# Patient Record
Sex: Male | Born: 1990 | Race: White | Hispanic: No | Marital: Single
Health system: Southern US, Academic
[De-identification: ages and names within clinical notes are randomized; demographics above are authoritative.]

## PROBLEM LIST (undated history)

## (undated) DIAGNOSIS — M419 Scoliosis, unspecified: Secondary | ICD-10-CM

## (undated) DIAGNOSIS — G545 Neuralgic amyotrophy: Secondary | ICD-10-CM

---
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
## 2014-11-27 ENCOUNTER — Emergency Department (HOSPITAL_BASED_OUTPATIENT_CLINIC_OR_DEPARTMENT_OTHER): Payer: 59

## 2014-11-27 ENCOUNTER — Observation Stay (HOSPITAL_BASED_OUTPATIENT_CLINIC_OR_DEPARTMENT_OTHER)
Admission: EM | Admit: 2014-11-27 | Discharge: 2014-11-28 | Disposition: A | Discharge status: Home / Self Care | Payer: 59 | Attending: General Surgery | Admitting: General Surgery

## 2014-11-27 ENCOUNTER — Encounter (HOSPITAL_BASED_OUTPATIENT_CLINIC_OR_DEPARTMENT_OTHER): Payer: Self-pay

## 2014-11-27 DIAGNOSIS — F17210 Nicotine dependence, cigarettes, uncomplicated: Secondary | ICD-10-CM | POA: Diagnosis not present

## 2014-11-27 DIAGNOSIS — K3580 Unspecified acute appendicitis: Principal | ICD-10-CM | POA: Diagnosis present

## 2014-11-27 DIAGNOSIS — M419 Scoliosis, unspecified: Secondary | ICD-10-CM | POA: Insufficient documentation

## 2014-11-27 DIAGNOSIS — R109 Unspecified abdominal pain: Secondary | ICD-10-CM

## 2014-11-27 DIAGNOSIS — K353 Acute appendicitis with localized peritonitis, without perforation or gangrene: Secondary | ICD-10-CM

## 2014-11-27 HISTORY — DX: Scoliosis, unspecified: M41.9

## 2014-11-27 LAB — CBC WITH DIFFERENTIAL/PLATELET
Basophils Absolute: 0 10*3/uL (ref 0.0–0.1)
Basophils Relative: 0 % (ref 0–1)
EOS ABS: 0 10*3/uL (ref 0.0–0.7)
EOS PCT: 0 % (ref 0–5)
HEMATOCRIT: 49.6 % (ref 39.0–52.0)
HEMOGLOBIN: 17.3 g/dL — AB (ref 13.0–17.0)
LYMPHS ABS: 3 10*3/uL (ref 0.7–4.0)
Lymphocytes Relative: 17 % (ref 12–46)
MCH: 31.3 pg (ref 26.0–34.0)
MCHC: 34.9 g/dL (ref 30.0–36.0)
MCV: 89.7 fL (ref 78.0–100.0)
MONO ABS: 1.3 10*3/uL — AB (ref 0.1–1.0)
MONOS PCT: 7 % (ref 3–12)
NEUTROS PCT: 76 % (ref 43–77)
Neutro Abs: 13.5 10*3/uL — ABNORMAL HIGH (ref 1.7–7.7)
Platelets: 249 10*3/uL (ref 150–400)
RBC: 5.53 MIL/uL (ref 4.22–5.81)
RDW: 12.9 % (ref 11.5–15.5)
WBC: 17.8 10*3/uL — ABNORMAL HIGH (ref 4.0–10.5)

## 2014-11-27 LAB — URINALYSIS, ROUTINE W REFLEX MICROSCOPIC
Bilirubin Urine: NEGATIVE
Glucose, UA: NEGATIVE mg/dL
Hgb urine dipstick: NEGATIVE
Ketones, ur: NEGATIVE mg/dL
LEUKOCYTES UA: NEGATIVE
NITRITE: NEGATIVE
PH: 7 (ref 5.0–8.0)
PROTEIN: NEGATIVE mg/dL
Specific Gravity, Urine: 1.01 (ref 1.005–1.030)
Urobilinogen, UA: 0.2 mg/dL (ref 0.0–1.0)

## 2014-11-27 LAB — BASIC METABOLIC PANEL
ANION GAP: 7 (ref 5–15)
BUN: 11 mg/dL (ref 6–20)
CHLORIDE: 102 mmol/L (ref 101–111)
CO2: 28 mmol/L (ref 22–32)
Calcium: 9 mg/dL (ref 8.9–10.3)
Creatinine, Ser: 0.86 mg/dL (ref 0.61–1.24)
GFR calc non Af Amer: >60 mL/min (ref >60)
Glucose, Bld: 115 mg/dL — ABNORMAL HIGH (ref 70–99)
POTASSIUM: 4 mmol/L (ref 3.5–5.1)
Sodium: 137 mmol/L (ref 135–145)

## 2014-11-27 LAB — LIPASE, BLOOD: Lipase: 23 U/L (ref 22–51)

## 2014-11-27 MED ORDER — ONDANSETRON HCL 4 MG/2ML IJ SOLN
4.0000 mg | Freq: Once | INTRAMUSCULAR | Status: AC
  Administered 2014-11-27: 4 mg via INTRAVENOUS
  Filled 2014-11-27: qty 2

## 2014-11-27 MED ORDER — SODIUM CHLORIDE 0.9 % IV SOLN
Freq: Once | INTRAVENOUS | Status: AC
  Administered 2014-11-27: 18:00:00 via INTRAVENOUS

## 2014-11-27 MED ORDER — HYDROMORPHONE HCL 1 MG/ML IJ SOLN
INTRAMUSCULAR | Status: AC
  Filled 2014-11-27: qty 1

## 2014-11-27 MED ORDER — HYDROMORPHONE HCL 1 MG/ML IJ SOLN
0.5000 mg | INTRAMUSCULAR | Status: DC | PRN
Start: 2014-11-27 — End: 2014-11-28
  Administered 2014-11-28 (×2): 2 mg via INTRAVENOUS
  Filled 2014-11-27 (×2): qty 2

## 2014-11-27 MED ORDER — ONDANSETRON HCL 4 MG/2ML IJ SOLN: 4.0000 mg | Freq: Once | INTRAMUSCULAR | Status: AC

## 2014-11-27 MED ORDER — IOHEXOL 300 MG/ML  SOLN
100.0000 mL | Freq: Once | INTRAMUSCULAR | Status: AC | PRN
  Administered 2014-11-27: 100 mL via INTRAVENOUS

## 2014-11-27 MED ORDER — HYDROMORPHONE HCL 1 MG/ML IJ SOLN
1.0000 mg | Freq: Once | INTRAMUSCULAR | Status: AC
Start: 2014-11-27 — End: 2014-11-27
  Administered 2014-11-27: 1 mg via INTRAVENOUS

## 2014-11-27 MED ORDER — HYDROMORPHONE HCL 1 MG/ML IJ SOLN
1.0000 mg | Freq: Once | INTRAMUSCULAR | Status: AC
  Administered 2014-11-27: 1 mg via INTRAVENOUS
  Filled 2014-11-27: qty 1

## 2014-11-27 MED ORDER — ONDANSETRON HCL 4 MG/2ML IJ SOLN
4.0000 mg | Freq: Four times a day (QID) | INTRAMUSCULAR | Status: DC | PRN
  Administered 2014-11-27 – 2014-11-28 (×2): 4 mg via INTRAVENOUS
  Filled 2014-11-27 (×2): qty 2

## 2014-11-27 MED ORDER — MORPHINE SULFATE 2 MG/ML IJ SOLN
1.0000 mg | INTRAMUSCULAR | Status: DC | PRN
  Administered 2014-11-27: 2 mg via INTRAVENOUS
  Filled 2014-11-27: qty 1

## 2014-11-27 MED ORDER — IOHEXOL 300 MG/ML  SOLN: 50.0000 mL | Freq: Once | INTRAMUSCULAR | Status: AC | PRN

## 2014-11-27 NOTE — ED Notes (Signed)
Complains of moderate nausea

## 2014-11-27 NOTE — ED Notes (Signed)
Dr. Byerly at bedside.  

## 2014-11-27 NOTE — H&P (Addendum)
Bradley Cain is an 24 y.o. male.   Chief Complaint: RLQ pain HPI:  Pt is a 24 yo M who presented with around 12 hours of acute RLQ pain.  This started around 11 AM.  The pain at first felt like a pulled muscle and was mild "like a 2 or 3."  The pain worsened.  The patient has had one episode of diarrhea, and had nausea/vomiting after drinking CT scan contrast.  He denies fever/chills.  He has had anorexia.  Pain is described as sharp.  Only narcotics have improved the pain.  Pain is worse with movement.     Past Medical History  Diagnosis Date  . Scoliosis     History reviewed. No pertinent past surgical history.  History reviewed. No pertinent family history. Social History:  reports that he has been smoking.  He does not have any smokeless tobacco history on file. He reports that he drinks alcohol. He reports that he uses illicit drugs (Marijuana).  Allergies: No Known Allergies  Meds:  MVT  Results for orders placed or performed during the hospital encounter of 11/27/14 (from the past 48 hour(s))  Basic metabolic panel     Status: Abnormal   Collection Time: 11/27/14  6:00 PM  Result Value Ref Range   Sodium 137 135 - 145 mmol/L   Potassium 4.0 3.5 - 5.1 mmol/L   Chloride 102 101 - 111 mmol/L   CO2 28 22 - 32 mmol/L   Glucose, Bld 115 (H) 70 - 99 mg/dL   BUN 11 6 - 20 mg/dL   Creatinine, Ser 0.86 0.61 - 1.24 mg/dL   Calcium 9.0 8.9 - 10.3 mg/dL   GFR calc non Af Amer >60 >60 mL/min   GFR calc Af Amer >60 >60 mL/min    Comment: (NOTE) The eGFR has been calculated using the CKD EPI equation. This calculation has not been validated in all clinical situations. eGFR's persistently <90 mL/min signify possible Chronic Kidney Disease.    Anion gap 7 5 - 15  CBC WITH DIFFERENTIAL     Status: Abnormal   Collection Time: 11/27/14  6:00 PM  Result Value Ref Range   WBC 17.8 (H) 4.0 - 10.5 K/uL   RBC 5.53 4.22 - 5.81 MIL/uL   Hemoglobin 17.3 (H) 13.0 - 17.0 g/dL   HCT 49.6  39.0 - 52.0 %   MCV 89.7 78.0 - 100.0 fL   MCH 31.3 26.0 - 34.0 pg   MCHC 34.9 30.0 - 36.0 g/dL   RDW 12.9 11.5 - 15.5 %   Platelets 249 150 - 400 K/uL   Neutrophils Relative % 76 43 - 77 %   Neutro Abs 13.5 (H) 1.7 - 7.7 K/uL   Lymphocytes Relative 17 12 - 46 %   Lymphs Abs 3.0 0.7 - 4.0 K/uL   Monocytes Relative 7 3 - 12 %   Monocytes Absolute 1.3 (H) 0.1 - 1.0 K/uL   Eosinophils Relative 0 0 - 5 %   Eosinophils Absolute 0.0 0.0 - 0.7 K/uL   Basophils Relative 0 0 - 1 %   Basophils Absolute 0.0 0.0 - 0.1 K/uL  Lipase, blood     Status: None   Collection Time: 11/27/14  6:00 PM  Result Value Ref Range   Lipase 23 22 - 51 U/L  Urinalysis, Routine w reflex microscopic     Status: None   Collection Time: 11/27/14  6:25 PM  Result Value Ref Range   Color, Urine YELLOW YELLOW  APPearance CLEAR CLEAR   Specific Gravity, Urine 1.010 1.005 - 1.030   pH 7.0 5.0 - 8.0   Glucose, UA NEGATIVE NEGATIVE mg/dL   Hgb urine dipstick NEGATIVE NEGATIVE   Bilirubin Urine NEGATIVE NEGATIVE   Ketones, ur NEGATIVE NEGATIVE mg/dL   Protein, ur NEGATIVE NEGATIVE mg/dL   Urobilinogen, UA 0.2 0.0 - 1.0 mg/dL   Nitrite NEGATIVE NEGATIVE   Leukocytes, UA NEGATIVE NEGATIVE    Comment: MICROSCOPIC NOT DONE ON URINES WITH NEGATIVE PROTEIN, BLOOD, LEUKOCYTES, NITRITE, OR GLUCOSE <1000 mg/dL.   Ct Abdomen Pelvis W Contrast  11/27/2014   CLINICAL DATA:  Sudden onset of right lower quadrant abdominal pain today with nausea, diarrhea. Leukocytosis. Initial encounter.  EXAM: CT ABDOMEN AND PELVIS WITH CONTRAST  TECHNIQUE: Multidetector CT imaging of the abdomen and pelvis was performed using the standard protocol following bolus administration of intravenous contrast.  CONTRAST:  176m OMNIPAQUE IOHEXOL 300 MG/ML  SOLN  COMPARISON:  None.  FINDINGS: Lower chest: Clear lung bases. No significant pleural or pericardial effusion. Bilateral gynecomastia noted.  Hepatobiliary: The liver is normal in density without  focal abnormality. Possible mild periportal edema within the liver. No evidence of gallstones or biliary dilatation.  Pancreas: Unremarkable. No pancreatic ductal dilatation or surrounding inflammatory changes.  Spleen: Normal in size without focal abnormality.  Adrenals/Urinary Tract: Both adrenal glands appear normal.Probable small cyst in the upper pole of the left kidney. The kidneys otherwise appear normal without evidence of urinary tract calculus, suspicious lesion or hydronephrosis. No bladder abnormalities are seen.  Stomach/Bowel: The appendix is distended to 10 mm. There is mild appendiceal wall thickening with minimal surrounding inflammatory change. No extraluminal fluid collection. Ingested material is present within the stomach. The small bowel and colon appear normal.  Vascular/Lymphatic: There are no enlarged abdominal or pelvic lymph nodes. No significant vascular findings are present.  Reproductive: Unremarkable.  Other: No evidence of abdominal wall mass or hernia.  Musculoskeletal: No acute or significant osseous findings.  IMPRESSION: 1. Appendiceal distention, mild wall thickening and minimal surrounding inflammatory change suspicious for early acute appendicitis. No evidence of rupture or abscess. 2. No other significant findings. 3. These results were called by telephone at the time of interpretation on 11/27/2014 at 8:12 pm to Dr. JCarlisle Cater, who verbally acknowledged these results.   Electronically Signed   By: WRichardean SaleM.D.   On: 11/27/2014 20:12    Review of Systems  Constitutional: Negative for fever and chills.  HENT: Negative.   Eyes: Negative.   Respiratory: Negative.   Cardiovascular: Negative.   Gastrointestinal: Positive for nausea and diarrhea.  Genitourinary: Negative.   Musculoskeletal: Negative.   Skin: Negative.   Neurological: Negative.   Endo/Heme/Allergies: Negative.   Psychiatric/Behavioral: Negative.     Blood pressure 136/78, pulse 60,  temperature 98.1 F (36.7 C), temperature source Oral, resp. rate 18, height 5' 7"  (1.702 m), weight 81.647 kg (180 lb), SpO2 98 %. Physical Exam  Constitutional: He is oriented to person, place, and time. He appears well-developed and well-nourished. No distress.  HENT:  Head: Normocephalic and atraumatic.  Eyes: Conjunctivae are normal. Pupils are equal, round, and reactive to light. Right eye exhibits no discharge. Left eye exhibits no discharge. No scleral icterus.  Neck: Normal range of motion. No tracheal deviation present. No thyromegaly present.  Cardiovascular: Normal rate and intact distal pulses.   Respiratory: Effort normal. No respiratory distress.  GI: Soft. He exhibits no distension. There is tenderness (RLQ). There is guarding (  voluntary guarding RLQ). There is no rebound.  Musculoskeletal: Normal range of motion.  Neurological: He is alert and oriented to person, place, and time.  Skin: Skin is warm and dry. No rash noted. He is not diaphoretic. No erythema. No pallor.  Psychiatric: He has a normal mood and affect. His behavior is normal. Judgment and thought content normal.     Assessment/Plan Acute appendicitis. NPO IVF IV Antibiotics Pain control To OR in the AM with Dr. Rosendo Gros for laparoscopic appendectomy  Appendectomy was described to the patient.  The incisions and surgical technique were explained.  The patient was advised that some of the hair on the abdomen would be clipped, and that a foley catheter would be placed.  I advised the patient of the risks of surgery including, but not limited to, bleeding, infection, damage to other structures, risk of an open operation, risk of abscess, and risk of blood clot.  The recovery was also described to the patient.  He was advised that he will have lifting restrictions for 2 weeks.     Eddison Searls 11/27/2014, 10:40 PM

## 2014-11-27 NOTE — ED Notes (Signed)
PT with sudden onset of RLQ pain since 11am with nausea, diarrhea, worse with movement.

## 2014-11-27 NOTE — ED Notes (Signed)
Mild nausea

## 2014-11-27 NOTE — ED Notes (Signed)
[facility]

## 2014-11-27 NOTE — ED Provider Notes (Signed)
CSN: [id number]     Arrival date & time 11/27/14  1734 History   First MD Initiated Contact with Patient 11/27/14 1807     Chief Complaint  Patient presents with  . Abdominal Pain     (Consider location/radiation/quality/duration/timing/severity/associated sxs/prior Treatment) HPI Comments: Patient with no past surgical history presents with complaint of right lower quadrant abdominal pain starting acutely at 11 AM today. It is described as sharp and nonradiating. Pain was strong at the onset and gradually worsened. It was associated with one episode of watery, nonbloody diarrhea. Patient had associated nausea but no vomiting. No urinary symptoms including hematuria. No history of kidney stones. No fever, chest pain, shortness of breath. No penile discharge. No treatments prior to arrival. Pain is worse with movement and palpation. Nothing makes it better. Last oral intake 5pm.   The history is provided by the patient.    Past Medical History  Diagnosis Date  . Scoliosis    History reviewed. No pertinent past surgical history. History reviewed. No pertinent family history. History  Substance Use Topics  . Smoking status: Current Every Day Smoker  . Smokeless tobacco: Not on file  . Alcohol Use: Yes     Comment: every other day    Review of Systems  Constitutional: Negative for fever.  HENT: Negative for rhinorrhea and sore throat.   Eyes: Negative for redness.  Respiratory: Negative for cough.   Cardiovascular: Negative for chest pain.  Gastrointestinal: Positive for nausea, abdominal pain and diarrhea. Negative for vomiting.  Genitourinary: Negative for dysuria.  Musculoskeletal: Negative for myalgias.  Skin: Negative for rash.  Neurological: Negative for headaches.      Allergies  Review of patient's allergies indicates no known allergies.  Home Medications   Prior to Admission medications   Not on File   BP 134/83 mmHg  Pulse 65  Temp(Src) 98.2 F (36.8 C)  (Oral)  Resp 16  Ht 5\' 7"  (1.702 m)  Wt 180 lb (81.647 kg)  BMI 28.19 kg/m2  SpO2 99%   Physical Exam  Constitutional: He appears well-developed and well-nourished.  HENT:  Head: Normocephalic and atraumatic.  Eyes: Conjunctivae are normal. Right eye exhibits no discharge. Left eye exhibits no discharge.  Neck: Normal range of motion. Neck supple.  Cardiovascular: Normal rate, regular rhythm and normal heart sounds.   Pulmonary/Chest: Effort normal and breath sounds normal.  Abdominal: Soft. Bowel sounds are decreased. There is tenderness in the right lower quadrant and suprapubic area. There is tenderness at McBurney's point. There is no rigidity, no rebound, no guarding and negative Murphy's sign.  Neurological: He is alert.  Skin: Skin is warm and dry.  Psychiatric: He has a normal mood and affect.  Nursing note and vitals reviewed.   ED Course  Procedures (including critical care time) Labs Review Labs Reviewed  BASIC METABOLIC PANEL - Abnormal; Notable for the following:    Glucose, Bld 115 (*)    All other components within normal limits  CBC WITH DIFFERENTIAL/PLATELET - Abnormal; Notable for the following:    WBC 17.8 (*)    Hemoglobin 17.3 (*)    Neutro Abs 13.5 (*)    Monocytes Absolute 1.3 (*)    All other components within normal limits  LIPASE, BLOOD  URINALYSIS, ROUTINE W REFLEX MICROSCOPIC    Imaging Review Ct Abdomen Pelvis W Contrast  11/27/2014   CLINICAL DATA:  Sudden onset of right lower quadrant abdominal pain today with nausea, diarrhea. Leukocytosis. Initial encounter.  EXAM: CT  ABDOMEN AND PELVIS WITH CONTRAST  TECHNIQUE: Multidetector CT imaging of the abdomen and pelvis was performed using the standard protocol following bolus administration of intravenous contrast.  CONTRAST:  OMNIPAQUE IOHEXOL 300 MG/ML  SOLN  COMPARISON:  None.  FINDINGS: Lower chest: Clear lung bases. No significant pleural or pericardial effusion. Bilateral gynecomastia  noted.  Hepatobiliary: The liver is normal in density without focal abnormality. Possible mild periportal edema within the liver. No evidence of gallstones or biliary dilatation.  Pancreas: Unremarkable. No pancreatic ductal dilatation or surrounding inflammatory changes.  Spleen: Normal in size without focal abnormality.  Adrenals/Urinary Tract: Both adrenal glands appear normal.Probable small cyst in the upper pole of the left kidney. The kidneys otherwise appear normal without evidence of urinary tract calculus, suspicious lesion or hydronephrosis. No bladder abnormalities are seen.  Stomach/Bowel: The appendix is distended to 10 mm. There is mild appendiceal wall thickening with minimal surrounding inflammatory change. No extraluminal fluid collection. Ingested material is present within the stomach. The small bowel and colon appear normal.  Vascular/Lymphatic: There are no enlarged abdominal or pelvic lymph nodes. No significant vascular findings are present.  Reproductive: Unremarkable.  Other: No evidence of abdominal wall mass or hernia.  Musculoskeletal: No acute or significant osseous findings.  IMPRESSION: 1. Appendiceal distention, mild wall thickening and minimal surrounding inflammatory change suspicious for early acute appendicitis. No evidence of rupture or abscess. 2. No other significant findings. 3. These results were called by telephone at the time of interpretation on 11/27/2014 at 8:12 pm to Dr. Renne Crigler , who verbally acknowledged these results.   Electronically Signed   By: Carey Bullocks M.D.   On: 11/27/2014 20:12     EKG Interpretation None       6:15 PM Patient seen and examined. Work-up initiated. Medications ordered.   Vital signs reviewed and are as follows: BP 134/83 mmHg  Pulse 65  Temp(Src) 98.2 F (36.8 C) (Oral)  Resp 16  Ht  (1.702 m)  Wt 180 lb (81.647 kg)  BMI 28.19 kg/m2  SpO2 99%  8:37 PM spoke with radiologist regarding CT findings of acute  appendicitis. Patient has remained nothing by mouth since 5 PM, although patient vomited after eating earlier.  Spoke with Dr. Donell Beers who accepts patient.   Spoke with Dr. Ethelda Chick who is aware patient coming to ED.   Patient updated and additional pain medication ordered.   MDM   Final diagnoses:  Abdominal pain  Acute appendicitis with localized peritonitis   Admit.     Renne Crigler, PA-C 11/27/14 2040  Vanetta Mulders, MD 11/30/14 [phone]

## 2014-11-27 NOTE — ED Notes (Signed)
Bed: WA08 Expected date:  Expected time:  Means of arrival:  Comments: Transfer [facility]  Appendicitis

## 2014-11-27 NOTE — ED Notes (Signed)
Notified Art, [facility] to page Dr. Donell BeersByerly for this patient.

## 2014-11-27 NOTE — ED Provider Notes (Signed)
9:59 PM Assumed care of patient.  [facility]end[facility].  Dr. Donell BeersByerly with General Surgery was consulted by previous provider and agreed to admit the patient.  Patient reports that his pain and nausea are controlled at this time.  Dr. Donell BeersByerly has been paged and notified that the patient is in the ED.    Patient alert and orientated x 3  Heart:  RRR Lungs:  CTAB Abdomen:  Soft with tenderness to palpation of the RLQ.  No rebound or guarding.  11:30 PM Patient evaluated by Dr. Donell BeersByerly.  Patient admitted to Surgery.    Santiago GladHeather Valetta Mulroy, PA-C 11/28/14 [phone]

## 2014-11-28 ENCOUNTER — Observation Stay (HOSPITAL_COMMUNITY): Payer: 59 | Admitting: Anesthesiology

## 2014-11-28 ENCOUNTER — Encounter (HOSPITAL_COMMUNITY): Payer: Self-pay | Admitting: Certified Registered Nurse Anesthetist

## 2014-11-28 ENCOUNTER — Encounter (HOSPITAL_COMMUNITY)
Admission: EM | Disposition: A | Discharge status: Home / Self Care | Payer: Self-pay | Source: Home / Self Care | Attending: Emergency Medicine

## 2014-11-28 HISTORY — PX: LAPAROSCOPIC APPENDECTOMY: SHX408

## 2014-11-28 LAB — CBC
HCT: 47.8 % (ref 39.0–52.0)
Hemoglobin: 15.9 g/dL (ref 13.0–17.0)
MCH: 30.5 pg (ref 26.0–34.0)
MCHC: 33.3 g/dL (ref 30.0–36.0)
MCV: 91.7 fL (ref 78.0–100.0)
PLATELETS: 201 10*3/uL (ref 150–400)
RBC: 5.21 MIL/uL (ref 4.22–5.81)
RDW: 13 % (ref 11.5–15.5)
WBC: 20.5 10*3/uL — AB (ref 4.0–10.5)

## 2014-11-28 LAB — BASIC METABOLIC PANEL
Anion gap: 7 (ref 5–15)
BUN: 7 mg/dL (ref 6–20)
CHLORIDE: 102 mmol/L (ref 101–111)
CO2: 29 mmol/L (ref 22–32)
Calcium: 8.9 mg/dL (ref 8.9–10.3)
Creatinine, Ser: 0.85 mg/dL (ref 0.61–1.24)
Glucose, Bld: 133 mg/dL — ABNORMAL HIGH (ref 70–99)
POTASSIUM: 3.9 mmol/L (ref 3.5–5.1)
Sodium: 138 mmol/L (ref 135–145)

## 2014-11-28 LAB — SURGICAL PCR SCREEN
MRSA, PCR: NEGATIVE
Staphylococcus aureus: POSITIVE — AB

## 2014-11-28 SURGERY — APPENDECTOMY, LAPAROSCOPIC
Anesthesia: General | Site: Abdomen

## 2014-11-28 MED ORDER — KCL IN DEXTROSE-NACL 20-5-0.45 MEQ/L-%-% IV SOLN
INTRAVENOUS | Status: DC
  Administered 2014-11-28 (×2): via INTRAVENOUS
  Filled 2014-11-28 (×4): qty 1000

## 2014-11-28 MED ORDER — METOCLOPRAMIDE HCL 5 MG/ML IJ SOLN
INTRAMUSCULAR | Status: AC
  Filled 2014-11-28: qty 2

## 2014-11-28 MED ORDER — GLYCOPYRROLATE 0.2 MG/ML IJ SOLN
INTRAMUSCULAR | Status: DC | PRN
Start: 2014-11-28 — End: 2014-11-28
  Administered 2014-11-28: 0.2 mg via INTRAVENOUS
  Administered 2014-11-28: 0.6 mg via INTRAVENOUS

## 2014-11-28 MED ORDER — ROCURONIUM BROMIDE 100 MG/10ML IV SOLN
INTRAVENOUS | Status: AC
  Filled 2014-11-28: qty 1

## 2014-11-28 MED ORDER — PIPERACILLIN-TAZOBACTAM 3.375 G IVPB
3.3750 g | Freq: Three times a day (TID) | INTRAVENOUS | Status: DC
  Administered 2014-11-28 (×2): 3.375 g via INTRAVENOUS
  Filled 2014-11-28 (×4): qty 50

## 2014-11-28 MED ORDER — NEOSTIGMINE METHYLSULFATE 10 MG/10ML IV SOLN
INTRAVENOUS | Status: DC | PRN
  Administered 2014-11-28: 5 mg via INTRAVENOUS

## 2014-11-28 MED ORDER — GLYCOPYRROLATE 0.2 MG/ML IJ SOLN
INTRAMUSCULAR | Status: AC
  Filled 2014-11-28: qty 1

## 2014-11-28 MED ORDER — HYDROMORPHONE HCL 1 MG/ML IJ SOLN
0.2500 mg | INTRAMUSCULAR | Status: DC | PRN
  Administered 2014-11-28 (×2): 0.5 mg via INTRAVENOUS
  Administered 2014-11-28: 10:00:00 via INTRAVENOUS

## 2014-11-28 MED ORDER — GLYCOPYRROLATE 0.2 MG/ML IJ SOLN
INTRAMUSCULAR | Status: AC
  Filled 2014-11-28: qty 3

## 2014-11-28 MED ORDER — ONDANSETRON HCL 4 MG/2ML IJ SOLN
INTRAMUSCULAR | Status: DC | PRN
  Administered 2014-11-28: 4 mg via INTRAVENOUS

## 2014-11-28 MED ORDER — PANTOPRAZOLE SODIUM 40 MG IV SOLR
40.0000 mg | Freq: Every day | INTRAVENOUS | Status: DC
  Administered 2014-11-28: 40 mg via INTRAVENOUS
  Filled 2014-11-28 (×2): qty 40

## 2014-11-28 MED ORDER — HYDROCODONE-ACETAMINOPHEN 5-325 MG PO TABS: 1.0000 | ORAL_TABLET | ORAL | Status: AC | PRN

## 2014-11-28 MED ORDER — LACTATED RINGERS IV SOLN
INTRAVENOUS | Status: DC | PRN
  Administered 2014-11-28 (×2): via INTRAVENOUS

## 2014-11-28 MED ORDER — PIPERACILLIN-TAZOBACTAM 3.375 G IVPB
INTRAVENOUS | Status: AC
  Filled 2014-11-28: qty 50

## 2014-11-28 MED ORDER — ROCURONIUM BROMIDE 100 MG/10ML IV SOLN
INTRAVENOUS | Status: DC | PRN
  Administered 2014-11-28: 35 mg via INTRAVENOUS

## 2014-11-28 MED ORDER — SUCCINYLCHOLINE CHLORIDE 20 MG/ML IJ SOLN
INTRAMUSCULAR | Status: DC | PRN
  Administered 2014-11-28: 100 mg via INTRAVENOUS

## 2014-11-28 MED ORDER — DEXAMETHASONE SODIUM PHOSPHATE 10 MG/ML IJ SOLN
INTRAMUSCULAR | Status: AC
  Filled 2014-11-28: qty 1

## 2014-11-28 MED ORDER — ONDANSETRON HCL 4 MG PO TABS: 4.0000 mg | ORAL_TABLET | Freq: Four times a day (QID) | ORAL | Status: DC | PRN

## 2014-11-28 MED ORDER — LIDOCAINE HCL (CARDIAC) 20 MG/ML IV SOLN
INTRAVENOUS | Status: AC
  Filled 2014-11-28: qty 5

## 2014-11-28 MED ORDER — MIDAZOLAM HCL 2 MG/2ML IJ SOLN
INTRAMUSCULAR | Status: AC
  Filled 2014-11-28: qty 2

## 2014-11-28 MED ORDER — MIDAZOLAM HCL 5 MG/5ML IJ SOLN
INTRAMUSCULAR | Status: DC | PRN
  Administered 2014-11-28: 2 mg via INTRAVENOUS

## 2014-11-28 MED ORDER — LACTATED RINGERS IV SOLN
INTRAVENOUS | Status: DC
  Administered 2014-11-28: 09:00:00 via INTRAVENOUS

## 2014-11-28 MED ORDER — LACTATED RINGERS IR SOLN
Status: DC | PRN
  Administered 2014-11-28: 1000 mL

## 2014-11-28 MED ORDER — SENNA 8.6 MG PO TABS: 1.0000 | ORAL_TABLET | Freq: Two times a day (BID) | ORAL | Status: DC

## 2014-11-28 MED ORDER — PROPOFOL 10 MG/ML IV BOLUS
INTRAVENOUS | Status: DC | PRN
  Administered 2014-11-28: 150 mg via INTRAVENOUS

## 2014-11-28 MED ORDER — ONDANSETRON HCL 4 MG/2ML IJ SOLN
INTRAMUSCULAR | Status: AC
  Filled 2014-11-28: qty 2

## 2014-11-28 MED ORDER — NEOSTIGMINE METHYLSULFATE 10 MG/10ML IV SOLN
INTRAVENOUS | Status: AC
  Filled 2014-11-28: qty 1

## 2014-11-28 MED ORDER — ONDANSETRON HCL 4 MG/2ML IJ SOLN: 4.0000 mg | Freq: Four times a day (QID) | INTRAMUSCULAR | Status: DC | PRN

## 2014-11-28 MED ORDER — PROPOFOL 10 MG/ML IV BOLUS
INTRAVENOUS | Status: AC
  Filled 2014-11-28: qty 20

## 2014-11-28 MED ORDER — FENTANYL CITRATE (PF) 100 MCG/2ML IJ SOLN
INTRAMUSCULAR | Status: DC | PRN
  Administered 2014-11-28 (×5): 50 ug via INTRAVENOUS

## 2014-11-28 MED ORDER — HYDROMORPHONE HCL 1 MG/ML IJ SOLN
1.0000 mg | INTRAMUSCULAR | Status: DC | PRN
  Administered 2014-11-28: 1 mg via INTRAVENOUS
  Filled 2014-11-28: qty 1

## 2014-11-28 MED ORDER — DIPHENHYDRAMINE HCL 50 MG/ML IJ SOLN: 12.5000 mg | Freq: Four times a day (QID) | INTRAMUSCULAR | Status: DC | PRN

## 2014-11-28 MED ORDER — HYDROMORPHONE HCL 1 MG/ML IJ SOLN
INTRAMUSCULAR | Status: DC | PRN
  Administered 2014-11-28: 1 mg via INTRAVENOUS

## 2014-11-28 MED ORDER — HYDROMORPHONE HCL 1 MG/ML IJ SOLN
INTRAMUSCULAR | Status: AC
  Filled 2014-11-28: qty 1

## 2014-11-28 MED ORDER — DEXAMETHASONE SODIUM PHOSPHATE 10 MG/ML IJ SOLN
INTRAMUSCULAR | Status: DC | PRN
  Administered 2014-11-28: 10 mg via INTRAVENOUS

## 2014-11-28 MED ORDER — BUPIVACAINE-EPINEPHRINE (PF) 0.25% -1:200000 IJ SOLN
INTRAMUSCULAR | Status: AC
  Filled 2014-11-28: qty 30

## 2014-11-28 MED ORDER — ACETAMINOPHEN 650 MG RE SUPP: 650.0000 mg | Freq: Four times a day (QID) | RECTAL | Status: DC | PRN

## 2014-11-28 MED ORDER — LIDOCAINE HCL (CARDIAC) 20 MG/ML IV SOLN
INTRAVENOUS | Status: DC | PRN
  Administered 2014-11-28: 100 mg via INTRAVENOUS

## 2014-11-28 MED ORDER — BUPIVACAINE-EPINEPHRINE 0.25% -1:200000 IJ SOLN
INTRAMUSCULAR | Status: DC | PRN
  Administered 2014-11-28: 4 mL

## 2014-11-28 MED ORDER — HYDROCODONE-ACETAMINOPHEN 5-325 MG PO TABS: 1.0000 | ORAL_TABLET | ORAL | Status: DC | PRN

## 2014-11-28 MED ORDER — DIPHENHYDRAMINE HCL 12.5 MG/5ML PO ELIX: 12.5000 mg | ORAL_SOLUTION | Freq: Four times a day (QID) | ORAL | Status: DC | PRN

## 2014-11-28 MED ORDER — ACETAMINOPHEN 325 MG PO TABS
650.0000 mg | ORAL_TABLET | Freq: Four times a day (QID) | ORAL | Status: DC | PRN
Start: 2014-11-28 — End: 2014-11-28

## 2014-11-28 MED ORDER — METOCLOPRAMIDE HCL 5 MG/ML IJ SOLN
INTRAMUSCULAR | Status: DC | PRN
Start: 2014-11-28 — End: 2014-11-28
  Administered 2014-11-28: 10 mg via INTRAVENOUS

## 2014-11-28 MED ORDER — HYDROMORPHONE HCL 2 MG/ML IJ SOLN
INTRAMUSCULAR | Status: AC
  Filled 2014-11-28: qty 1

## 2014-11-28 MED ORDER — FENTANYL CITRATE (PF) 250 MCG/5ML IJ SOLN
INTRAMUSCULAR | Status: AC
  Filled 2014-11-28: qty 5

## 2014-11-28 SURGICAL SUPPLY — 41 items
APPLIER CLIP 5 13 M/L LIGAMAX5 (MISCELLANEOUS)
APPLIER CLIP ROT 10 11.4 M/L (STAPLE)
BENZOIN TINCTURE PRP APPL 2/3 (GAUZE/BANDAGES/DRESSINGS) ×2 IMPLANT
CHLORAPREP W/TINT 26ML (MISCELLANEOUS) ×2 IMPLANT
CLIP APPLIE 5 13 M/L LIGAMAX5 (MISCELLANEOUS) IMPLANT
CLIP APPLIE ROT 10 11.4 M/L (STAPLE) IMPLANT
COVER TRANSDUCER ULTRASND (DRAPES) ×2 IMPLANT
DECANTER SPIKE VIAL GLASS SM (MISCELLANEOUS) ×2 IMPLANT
DEVICE TROCAR PUNCTURE CLOSURE (ENDOMECHANICALS) ×2 IMPLANT
DRAPE LAPAROSCOPIC ABDOMINAL (DRAPES) ×2 IMPLANT
ELECT REM PT RETURN 9FT ADLT (ELECTROSURGICAL) ×2
ELECTRODE REM PT RTRN 9FT ADLT (ELECTROSURGICAL) ×1 IMPLANT
ENDOLOOP SUT PDS II  0 18 (SUTURE) ×3
ENDOLOOP SUT PDS II 0 18 (SUTURE) ×3 IMPLANT
GAUZE SPONGE 2X2 8PLY STRL LF (GAUZE/BANDAGES/DRESSINGS) ×1 IMPLANT
GLOVE BIO SURGEON STRL SZ7 (GLOVE) ×2 IMPLANT
GLOVE BIO SURGEON STRL SZ7.5 (GLOVE) ×2 IMPLANT
GLOVE BIOGEL PI IND STRL 6.5 (GLOVE) ×1 IMPLANT
GLOVE BIOGEL PI IND STRL 7.0 (GLOVE) ×2 IMPLANT
GLOVE BIOGEL PI IND STRL 7.5 (GLOVE) ×1 IMPLANT
GLOVE BIOGEL PI INDICATOR 6.5 (GLOVE) ×1
GLOVE BIOGEL PI INDICATOR 7.0 (GLOVE) ×2
GLOVE BIOGEL PI INDICATOR 7.5 (GLOVE) ×1
GOWN STRL REUS W/TWL XL LVL3 (GOWN DISPOSABLE) ×4 IMPLANT
KIT BASIN OR (CUSTOM PROCEDURE TRAY) ×2 IMPLANT
NEEDLE INSUFFLATION 14GA 120MM (NEEDLE) ×2 IMPLANT
SCISSORS LAP 5X35 DISP (ENDOMECHANICALS) ×2 IMPLANT
SET IRRIG TUBING LAPAROSCOPIC (IRRIGATION / IRRIGATOR) ×2 IMPLANT
SLEEVE XCEL OPT CAN 5 100 (ENDOMECHANICALS) ×2 IMPLANT
SOLUTION ANTI FOG 6CC (MISCELLANEOUS) IMPLANT
SPONGE GAUZE 2X2 STER 10/PKG (GAUZE/BANDAGES/DRESSINGS) ×1
STRIP CLOSURE SKIN 1/2X4 (GAUZE/BANDAGES/DRESSINGS) ×2 IMPLANT
SUT MNCRL AB 4-0 PS2 18 (SUTURE) ×4 IMPLANT
TOWEL OR 17X26 10 PK STRL BLUE (TOWEL DISPOSABLE) ×2 IMPLANT
TOWEL OR NON WOVEN STRL DISP B (DISPOSABLE) ×2 IMPLANT
TRAY FOLEY W/METER SILVER 14FR (SET/KITS/TRAYS/PACK) IMPLANT
TRAY FOLEY W/METER SILVER 16FR (SET/KITS/TRAYS/PACK) ×2 IMPLANT
TRAY LAPAROSCOPIC (CUSTOM PROCEDURE TRAY) ×2 IMPLANT
TROCAR BLADELESS OPT 5 100 (ENDOMECHANICALS) ×2 IMPLANT
TROCAR XCEL NON-BLD 11X100MML (ENDOMECHANICALS) ×2 IMPLANT
TUBING INSUFFLATION 10FT LAP (TUBING) IMPLANT

## 2014-11-28 NOTE — Discharge Summary (Signed)
Physician Discharge Summary  Patient ID: Bradley Cain MRN: [id number] DOB/AGE: 23/11/1990 23 y.o.  Admit date: 11/27/2014 Discharge date: 11/28/2014  Admission Diagnoses: Acute appendicitis  Discharge Diagnoses: Status post lap appendectomy Active Problems:   Acute appendicitis   Discharged Condition: good  Hospital Course: Patient was admitted status post laparoscopic appendectomy. Patient was started clear liquid diet which she was able to tolerate well. Patient was otherwise doing well on his own. He had good pain control. Patient is a low likelihood of postoperative complications and/or ileus. The patient was deemed stable for discharge and discharged home.  Consults: None  Significant Diagnostic Studies: none  Treatments: surgery: as above  Discharge Exam: Blood pressure 117/61, pulse 59, temperature 97.4 F (36.3 C), temperature source Oral, resp. rate 12, height 5\' 7"  (1.702 m), weight 78.971 kg (174 lb 1.6 oz), SpO2 96 %. General appearance: alert and cooperative GI: soft, non-tender; bowel sounds normal; no masses,  no organomegaly and wound c/d/i  Disposition: Final discharge disposition not confirmed  Discharge Instructions    Diet - low sodium heart healthy    Complete by:  As directed      Increase activity slowly    Complete by:  As directed             Medication List    TAKE these medications        HYDROcodone-acetaminophen 5-325 MG per tablet  Commonly known as:  NORCO/VICODIN  Take 1-2 tablets by mouth every 4 (four) hours as needed for moderate pain.     multivitamin with minerals Tabs tablet  Take 1 tablet by mouth daily.         Signed: Marigene EhlersRamirez Jr., Jonhatan Hearty 11/28/2014, 4:28 PM

## 2014-11-28 NOTE — Transfer of Care (Signed)
Immediate Anesthesia Transfer of Care Note  Patient: Bradley Cain  Procedure(s) Performed: Procedure(s): APPENDECTOMY LAPAROSCOPIC (N/A)  Patient Location: PACU  Anesthesia Type:General  Level of Consciousness:  sedated, patient cooperative and responds to stimulation  Airway & Oxygen Therapy:Patient Spontanous Breathing and Patient connected to face mask oxgen  Post-op Assessment:  Report given to PACU RN and Post -op Vital signs reviewed and stable  Post vital signs:  Reviewed and stable  Last Vitals:  Filed Vitals:   11/28/14 0639  BP: 111/69  Pulse: 76  Temp: 36.5 C  Resp: 18    Complications: No apparent anesthesia complications

## 2014-11-28 NOTE — Discharge Instructions (Signed)
Laparoscopic Appendectomy °Care After °Refer to this sheet in the next few weeks. These instructions provide you with information on caring for yourself after your procedure. Your caregiver may also give you more specific instructions. Your treatment has been planned according to current medical practices, but problems sometimes occur. Call your caregiver if you have any problems or questions after your procedure. °HOME CARE INSTRUCTIONS °· Do not drive while taking narcotic pain medicines. °· Use stool softener if you become constipated from your pain medicines. °· Change your bandages (dressings) as directed. °· Keep your wounds clean and dry. You may wash the wounds gently with soap and water. Gently pat the wounds dry with a clean towel. °· Do not take baths, swim, or use hot tubs for 10 days, or as instructed by your caregiver. °· Only take over-the-counter or prescription medicines for pain, discomfort, or fever as directed by your caregiver. °· You may continue your normal diet as directed. °· Do not lift more than 10 pounds (4.5 kg) or play contact sports for 3 weeks, or as directed. °· Slowly increase your activity after surgery. °· Take deep breaths to avoid getting a lung infection (pneumonia). °SEEK MEDICAL CARE IF: °· You have redness, swelling, or increasing pain in your wounds. °· You have pus coming from your wounds. °· You have drainage from a wound that lasts longer than 1 day. °· You notice a bad smell coming from the wounds or dressing. °· Your wound edges break open after stitches (sutures) have been removed. °· You notice increasing pain in the shoulders (shoulder strap areas) or near your shoulder blades. °· You develop dizzy episodes or fainting while standing. °· You develop shortness of breath. °· You develop persistent nausea or vomiting. °· You cannot control your bowel functions or lose your appetite. °· You develop diarrhea. °SEEK IMMEDIATE MEDICAL CARE IF:  °· You have a fever. °· You  develop a rash. °· You have difficulty breathing or sharp pains in your chest. °· You develop any reaction or side effects to medicines given. °MAKE SURE YOU: °· Understand these instructions. °· Will watch your condition. °· Will get help right away if you are not doing well or get worse. °Document Released: 07/15/2005 Document Revised: 10/07/2011 Document Reviewed: 01/22/2011 °Exit[employer]® Patient Information ©2015 [employer]. This information is not intended to replace advice given to you by your health care provider. Make sure you discuss any questions you have with your health care provider. ° °

## 2014-11-28 NOTE — Progress Notes (Signed)
Received report from night shift nurse. Pt transported down to OR with transportation and family. Will assess pt after surgery.

## 2014-11-28 NOTE — Anesthesia Postprocedure Evaluation (Signed)
  Anesthesia Post-op Note  Patient: Bradley Cain  Procedure(s) Performed: Procedure(s) (LRB): APPENDECTOMY LAPAROSCOPIC (N/A)  Patient Location: PACU  Anesthesia Type: General  Level of Consciousness: awake and alert   Airway and Oxygen Therapy: Patient Spontanous Breathing  Post-op Pain: mild  Post-op Assessment: Post-op Vital signs reviewed, Patient's Cardiovascular Status Stable, Respiratory Function Stable, Patent Airway and No signs of Nausea or vomiting  Last Vitals:  Filed Vitals:   11/28/14 1136  BP: 110/60  Pulse: 57  Temp: 37.1 C  Resp: 12    Post-op Vital Signs: stable   Complications: No apparent anesthesia complications

## 2014-11-28 NOTE — Anesthesia Procedure Notes (Signed)
Procedure Name: Intubation Date/Time: 11/28/2014 8:04 AM Performed by: Orest DikesPETERS, Chauna Osoria J Pre-anesthesia Checklist: Patient identified, Emergency Drugs available, Suction available and Patient being monitored Patient Re-evaluated:Patient Re-evaluated prior to inductionOxygen Delivery Method: Circle System Utilized Preoxygenation: Pre-oxygenation with 100% oxygen Intubation Type: IV induction, Rapid sequence and Cricoid Pressure applied Laryngoscope Size: Mac and 4 Grade View: Grade I Tube type: Oral Tube size: 7.5 mm Number of attempts: 1 Airway Equipment and Method: Stylet and Oral airway Placement Confirmation: ETT inserted through vocal cords under direct vision,  positive ETCO2 and breath sounds checked- equal and bilateral Secured at: 21 cm Tube secured with: Tape Dental Injury: Teeth and Oropharynx as per pre-operative assessment

## 2014-11-28 NOTE — Anesthesia Preprocedure Evaluation (Signed)
Anesthesia Evaluation  Patient identified by MRN, date of birth, ID band Patient awake    Reviewed: Allergy & Precautions, NPO status , Patient's Chart, lab work & pertinent test results  Airway Mallampati: II  TM Distance: >3 FB Neck ROM: Full    Dental no notable dental hx.    Pulmonary Current Smoker,  breath sounds clear to auscultation  Pulmonary exam normal       Cardiovascular negative cardio ROS  Rhythm:Regular Rate:Normal     Neuro/Psych negative neurological ROS  negative psych ROS   GI/Hepatic negative GI ROS, Neg liver ROS,   Endo/Other  negative endocrine ROS  Renal/GU negative Renal ROS  negative genitourinary   Musculoskeletal negative musculoskeletal ROS (+)   Abdominal   Peds negative pediatric ROS (+)  Hematology negative hematology ROS (+)   Anesthesia Other Findings   Reproductive/Obstetrics negative OB ROS                            Anesthesia Physical Anesthesia Plan  ASA: II and emergent  Anesthesia Plan: General   Post-op Pain Management:    Induction: Intravenous  Airway Management Planned: Oral ETT  Additional Equipment:   Intra-op Plan:   Post-operative Plan: Extubation in OR  Informed Consent: I have reviewed the patients History and Physical, chart, labs and discussed the procedure including the risks, benefits and alternatives for the proposed anesthesia with the patient or authorized representative who has indicated his/her understanding and acceptance.   Dental advisory given  Plan Discussed with: CRNA  Anesthesia Plan Comments:         Anesthesia Quick Evaluation  

## 2014-11-28 NOTE — Progress Notes (Signed)
Patient ID: Bradley Cain, male   DOB: 08/10/1990, 24 y.o.   MRN: [id number] Pt feeling well.  Ambulating and tol PO.  Pain control is well controlled. Will DC pt today with f/u  X 2 weeks

## 2014-11-28 NOTE — Op Note (Signed)
11/28/2014  8:57 AM  PATIENT:  Bradley Cain  24 y.o. male  PRE-OPERATIVE DIAGNOSIS:  appendicitis  POST-OPERATIVE DIAGNOSIS: acute non perforated appendicitis  PROCEDURE:  Procedure(s): APPENDECTOMY LAPAROSCOPIC (N/A)  SURGEON:  Surgeon(s) and Role:    * Axel FillerArmando Gibson Lad, MD - Primary  ANESTHESIA:   local and general  EBL:<5cc   Total I/O In: 1000 [I.V.:1000] Out: 100 [Urine:100]  BLOOD ADMINISTERED:none  DRAINS: none   LOCAL MEDICATIONS USED:  BUPIVICAINE   SPECIMEN:  Source of Specimen:  appendix  DISPOSITION OF SPECIMEN:  PATHOLOGY  COUNTS:  YES  TOURNIQUET:  * No tourniquets in log *  DICTATION: .Dragon Dictation  Counts: reported as correct x 2  Findings:  The patient had a acutely inflamed nonperforated appendix  Specimen: Appendix  Indications for procedure:  The patient is a 24 year old male with a history of periumbilical pain localized in the right lower quadrant patient had a CT scan which revealed signs consistent with acute appendicitis the patient back in for laparoscopic appendectomy.  Details of the procedure:The patient was taken back to the operating room. The patient was placed in supine position with bilateral SCDs in place. The patient was prepped and draped in the usual sterile fashion.  After appropriate anitbiotics were confirmed, a time-out was confirmed and all facts were verified.  A pneumoperitoneum of 14 mmHg was obtained via a Veress needle technique in the left lower quadrant quadrant.  A 5 mm trocar and 5 mm camera then placed intra-abdominally there is no injury to any intra-abdominal organs a 10 mm infraumbilical port was placed and direct visualization as was a 5 mm port in the suprapubic area. The appendix was identified  The appendix identified and cleaned down to the appendiceal base. The appendiceal artery was taken with Bovie cautery maintaining hemostasis, the mesoappendix was then incised.  The the appendiceal base was clean.   At this time an Endoloop was placed proximally x2 and one distally and the appendix was transected between these 2.   A retrieval bag was then placed into the abdomen and the specimen placed in the bag. The appendiceal stump was cauterized. We evacuate the fluid from the pelvis until the effluent was clear. The omentum was brought over the appendiceal stump. The appendix and retrieval  bag was then retrieved via the supraumbilical port. #1 Vicryl was used to reapproximate the fascia at the umbilical port site x2. The skin was reapproximated all port sites 3-0 Monocryl subcuticular fashion. The skin was dressed with Steri-Strips gauze and tape.   The patient was awakened from general anesthesia was taken to recovery room in stable condition.    PLAN OF CARE: Admit for overnight observation  PATIENT DISPOSITION:  PACU - hemodynamically stable.   Delay start of Pharmacological VTE agent (>24hrs) due to surgical blood loss or risk of bleeding: not applicable

## 2014-11-29 ENCOUNTER — Encounter (HOSPITAL_COMMUNITY): Payer: Self-pay | Admitting: General Surgery

## 2017-12-09 ENCOUNTER — Emergency Department (HOSPITAL_COMMUNITY): Payer: Self-pay

## 2017-12-09 ENCOUNTER — Other Ambulatory Visit: Payer: Self-pay

## 2017-12-09 ENCOUNTER — Emergency Department (HOSPITAL_COMMUNITY)
Admission: EM | Admit: 2017-12-09 | Discharge: 2017-12-09 | Disposition: A | Discharge status: Home / Self Care | Payer: Self-pay | Attending: Emergency Medicine | Admitting: Emergency Medicine

## 2017-12-09 ENCOUNTER — Encounter (HOSPITAL_COMMUNITY): Payer: Self-pay | Admitting: Emergency Medicine

## 2017-12-09 DIAGNOSIS — Z79899 Other long term (current) drug therapy: Secondary | ICD-10-CM | POA: Insufficient documentation

## 2017-12-09 DIAGNOSIS — M25521 Pain in right elbow: Secondary | ICD-10-CM | POA: Insufficient documentation

## 2017-12-09 DIAGNOSIS — F17210 Nicotine dependence, cigarettes, uncomplicated: Secondary | ICD-10-CM | POA: Insufficient documentation

## 2017-12-09 HISTORY — DX: Neuralgic amyotrophy: G54.5

## 2017-12-09 MED ORDER — IBUPROFEN 400 MG PO TABS: 400.0000 mg | ORAL_TABLET | Freq: Four times a day (QID) | ORAL | 0 refills | Status: AC | PRN

## 2017-12-09 MED ORDER — ACETAMINOPHEN 325 MG PO TABS: 650.0000 mg | ORAL_TABLET | Freq: Four times a day (QID) | ORAL | 0 refills | Status: AC | PRN

## 2017-12-09 NOTE — Discharge Instructions (Addendum)
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
## 2017-12-15 ENCOUNTER — Encounter (HOSPITAL_COMMUNITY): Payer: Self-pay | Admitting: Emergency Medicine

## 2017-12-15 ENCOUNTER — Ambulatory Visit (HOSPITAL_COMMUNITY)
Admission: EM | Admit: 2017-12-15 | Discharge: 2017-12-15 | Disposition: A | Discharge status: Home / Self Care | Payer: Self-pay | Attending: Family Medicine | Admitting: Family Medicine

## 2017-12-15 DIAGNOSIS — S59901A Unspecified injury of right elbow, initial encounter: Secondary | ICD-10-CM
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
## 2017-12-15 NOTE — ED Provider Notes (Signed)
  [facility] CARE CENTER   [id number] 12/15/17 Arrival Time: 1012  ASSESSMENT & PLAN:  1. Elbow injury, right, initial encounter    Note given for him to return to work without restriction. May f/u as needed.  Reviewed expectations re: course of current medical issues. Questions answered. Outlined signs and symptoms indicating need for more acute intervention. Patient verbalized understanding. After Visit Summary given.  SUBJECTIVE: History from: patient. Seen in ED on 12/09/2017. Note reviewed. Seen for elbow injury. This has improved. Feeling better and requires note in order to return to work. Minimal discomfort now. Bruising is resolving. Feels he can work his normal duties. No extremity sensation changes or weakness.  ROS: As per HPI.   OBJECTIVE:  Vitals:   12/15/17 1119  BP: 139/79  Pulse: 63  Resp: 18  Temp: 98.4 F (36.9 C)  TempSrc: Oral  SpO2: 100%    General appearance: alert; no distress Extremities: warm and well perfused; symmetrical with no gross deformities; no significant tenderness over his right elbow with no swelling and resolving bruising; ROM: normal CV: normal extremity capillary refill Skin: warm and dry Neurologic: normal gait; normal symmetric reflexes in all extremities; normal sensation in all extremities Psychological: alert and cooperative; normal mood and affect  No Known Allergies  Past Medical History:  Diagnosis Date  . Parsonage-Turner syndrome   . Scoliosis    Social History   Socioeconomic History  . Marital status: Single    Spouse name: Not on file  . Number of children: Not on file  . Years of education: Not on file  . Highest education level: Not on file  Occupational History  . Not on file  Social Needs  . Financial resource strain: Not on file  . Food insecurity:    Worry: Not on file    Inability: Not on file  . Transportation needs:    Medical: Not on file    Non-medical: Not on file  Tobacco Use  .  Smoking status: Current Every Day Smoker  Substance and Sexual Activity  . Alcohol use: Yes    Comment: every other day  . Drug use: Yes    Types: Marijuana  . Sexual activity: Not on file  Lifestyle  . Physical activity:    Days per week: Not on file    Minutes per session: Not on file  . Stress: Not on file  Relationships  . Social connections:    Talks on phone: Not on file    Gets together: Not on file    Attends religious service: Not on file    Active member of club or organization: Not on file    Attends meetings of clubs or organizations: Not on file    Relationship status: Not on file  . Intimate partner violence:    Fear of current or ex partner: Not on file    Emotionally abused: Not on file    Physically abused: Not on file    Forced sexual activity: Not on file  Other Topics Concern  . Not on file  Social History Narrative  . Not on file   History reviewed. No pertinent family history. Past Surgical History:  Procedure Laterality Date  . LAPAROSCOPIC APPENDECTOMY N/A 11/28/2014   Procedure: APPENDECTOMY LAPAROSCOPIC;  Surgeon: Axel Filler, MD;  Location: [facility];  Service: General;  Laterality: Vertis Kelch, MD 12/16/17 [phone]

## 2019-11-05 ENCOUNTER — Ambulatory Visit (INDEPENDENT_AMBULATORY_CARE_PROVIDER_SITE_OTHER): Payer: Medicaid Other

## 2019-11-05 ENCOUNTER — Other Ambulatory Visit (HOSPITAL_COMMUNITY): Payer: Medicaid Other | Admitting: Nephrology

## 2019-11-05 ENCOUNTER — Ambulatory Visit: Payer: Medicaid Other | Attending: Internal Medicine

## 2019-11-05 ENCOUNTER — Other Ambulatory Visit (INDEPENDENT_AMBULATORY_CARE_PROVIDER_SITE_OTHER): Payer: Medicaid Other

## 2019-11-05 ENCOUNTER — Other Ambulatory Visit (INDEPENDENT_AMBULATORY_CARE_PROVIDER_SITE_OTHER): Payer: Self-pay | Admitting: Internal Medicine

## 2019-11-05 ENCOUNTER — Other Ambulatory Visit: Payer: Self-pay

## 2019-11-05 DIAGNOSIS — F1910 Other psychoactive substance abuse, uncomplicated: Secondary | ICD-10-CM | POA: Insufficient documentation

## 2019-11-05 DIAGNOSIS — Z0000 Encounter for general adult medical examination without abnormal findings: Secondary | ICD-10-CM

## 2019-11-05 DIAGNOSIS — F17210 Nicotine dependence, cigarettes, uncomplicated: Secondary | ICD-10-CM

## 2019-11-05 DIAGNOSIS — M419 Scoliosis, unspecified: Secondary | ICD-10-CM | POA: Insufficient documentation

## 2019-11-05 LAB — LIPID PANEL
CHOLESTEROL: 186 mg/dL (ref 0–199)
HDL CHOL: 39 mg/dL — ABNORMAL LOW (ref 40–60)
LDL CALC: 71 mg/dL (ref 0–130)
TRIGLYCERIDES: 381 mg/dL — ABNORMAL HIGH (ref 0–149)
VLDL CALC: 76 mg/dL

## 2019-11-05 LAB — CBC WITH DIFF
BASOPHIL #: 0.04 10*3/uL (ref 0.00–0.10)
BASOPHIL %: 0 % (ref 0–1)
EOSINOPHIL #: 0.04 10*3/uL (ref 0.00–0.40)
EOSINOPHIL %: 0 % (ref 0–6)
HCT: 43.7 % (ref 40.7–52.3)
HGB: 14.8 g/dL (ref 12.5–16.9)
LYMPHOCYTE #: 3.08 10*3/uL (ref 1.00–3.70)
LYMPHOCYTE %: 32 % (ref 14–45)
MCH: 29.8 pg (ref 27.0–33.4)
MCHC: 33.9 g/dL (ref 31.0–37.0)
MCV: 88.1 fL (ref 84.1–99.3)
MONOCYTE #: 0.59 10*3/uL (ref 0.00–0.70)
MONOCYTE %: 6 % (ref 0–14)
NEUTROPHIL #: 5.85 10*3/uL (ref 1.50–7.00)
NEUTROPHIL %: 61 % (ref 43–80)
PLATELETS: 288 10*3/uL (ref 129–381)
RBC: 4.96 10*6/uL (ref 4.09–5.89)
RDW: 12.7 % (ref 10.8–15.2)
WBC: 9.6 10*3/uL (ref 2.6–9.8)

## 2019-11-05 LAB — HIV1/HIV2 SCREEN, COMBINED ANTIGEN AND ANTIBODY: HIV SCREEN, COMBINED ANTIGEN & ANTIBODY: NEGATIVE

## 2019-11-05 LAB — COMPREHENSIVE METABOLIC PANEL, NON-FASTING
ALBUMIN: 4.2 g/dL (ref 3.5–5.0)
ALKALINE PHOSPHATASE: 85 U/L (ref 38–126)
ALT (SGPT): 28 U/L (ref <=50)
ANION GAP: 9 mmol/L
AST (SGOT): 30 U/L (ref 17–59)
BILIRUBIN TOTAL: 0.3 mg/dL (ref 0.2–5.0)
BUN/CREA RATIO: 13
BUN: 11 mg/dL (ref 9–20)
CALCIUM: 9.3 mg/dL (ref 8.4–10.2)
CHLORIDE: 104 mmol/L (ref 98–107)
CO2 TOTAL: 27 mmol/L (ref 22–30)
CREATININE: 0.86 mg/dL (ref 0.66–1.25)
ESTIMATED GFR: 119 mL/min/{1.73_m2} (ref >=60)
GLUCOSE: 99 mg/dL (ref 74–106)
POTASSIUM: 4 mmol/L (ref 3.5–5.1)
PROTEIN TOTAL: 6.6 g/dL (ref 6.3–8.2)
SODIUM: 140 mmol/L (ref 137–145)

## 2019-11-05 LAB — URINALYSIS, MICROSCOPIC

## 2019-11-05 LAB — URINALYSIS, MACROSCOPIC
BILIRUBIN: NEGATIVE mg/dL
BLOOD: NEGATIVE mg/dL
GLUCOSE: NEGATIVE mg/dL
KETONES: NEGATIVE mg/dL
LEUKOCYTES: NEGATIVE WBCs/uL
NITRITE: NEGATIVE
PH: 6 (ref 5.0–8.0)
PROTEIN: NEGATIVE mg/dL
SPECIFIC GRAVITY: 1.015 (ref 1.005–1.030)
UROBILINOGEN: 0.2 mg/dL

## 2019-11-05 LAB — HEPATITIS B SURFACE ANTIGEN: HBV SURFACE ANTIGEN QUALITATIVE: NEGATIVE

## 2019-11-05 LAB — HEPATITIS A (HAV) IGM ANTIBODY: HAV IGM: NEGATIVE

## 2019-11-05 LAB — VITAMIN D 25 TOTAL: VITAMIN D 25, TOTAL: 18.7 ng/mL — ABNORMAL LOW (ref 30.00–100.00)

## 2019-11-05 LAB — HEPATITIS B CORE IGM, AB: HBV CORE IGM ANTIBODY QUALITATIVE: NEGATIVE

## 2019-11-05 LAB — HEPATITIS A (HAV) IGG ANTIBODY: HAV IGG ANTIBODY: NEGATIVE

## 2019-11-05 LAB — HEPATITIS C ANTIBODY SCREEN WITH REFLEX TO HCV PCR: HCV ANTIBODY QUALITATIVE: NEGATIVE

## 2019-11-05 NOTE — Ancillary Notes (Signed)
Department of Community Practice     Venipuncture performed in office on right arm antecubital vein, dry pressure dressing was applied to site and patient tolerated it well.  Specimen was centrifuged, aliquoted as needed and specimen was labeled and packaged for transport.    Julio Sicks, PHLEBOTOMIST  11/05/2019, 14:34

## 2019-11-08 LAB — SYPHILIS, RAPID PLASMIN REAGIN (RPR), WITH TITER REFLEX: RPR QUALITATIVE: NONREACTIVE

## 2019-11-09 LAB — HERPES SIMPLEX VIRUS (HSV) TYPE 1- AND TYPE 2-SPECIFIC ABS, IGG
HSV-1 IGG QUALITATIVE: NEGATIVE
HSV2 IGG QUALITATIVE: NEGATIVE

## 2019-11-30 ENCOUNTER — Other Ambulatory Visit (INDEPENDENT_AMBULATORY_CARE_PROVIDER_SITE_OTHER): Payer: Medicaid Other

## 2019-11-30 ENCOUNTER — Other Ambulatory Visit: Payer: Self-pay

## 2019-11-30 ENCOUNTER — Other Ambulatory Visit: Payer: Medicaid Other | Attending: Internal Medicine | Admitting: Internal Medicine

## 2019-11-30 DIAGNOSIS — R5383 Other fatigue: Secondary | ICD-10-CM

## 2019-11-30 LAB — SEDIMENTATION RATE: ERYTHROCYTE SEDIMENTATION RATE (ESR): 6 mm/hr (ref 0–10)

## 2019-11-30 LAB — THYROID STIMULATING HORMONE (SENSITIVE TSH): TSH: 1.12 u[IU]/mL (ref 0.465–4.680)

## 2019-11-30 NOTE — Ancillary Notes (Signed)
Department of Community Practice     Venipuncture performed in office on RIGHT arm antecubital vein, dry pressure dressing was applied to site and patient tolerated it well.  Specimen was centrifuged, aliquoted as needed and specimen was labeled and packaged for transport.    Feliz Beam, PHLEBOTOMIST  11/30/2019, 13:14

## 2019-12-09 LAB — DHEA SULFATE
ALBUMIN: 4.7 g/dL (ref 3.6–5.1)
SEX HORMONE BINDING GLOB.: 20 nmol/L (ref 10–50)
TESTOSTERONE, BIOAVAILABLE: 55.7 ng/dL — ABNORMAL LOW (ref 110.0–575.0)
TESTOSTERONE, FREE: 26 pg/mL — ABNORMAL LOW (ref 46.0–224.0)
TESTOSTERONE,TOTAL,LC/MS/MS: 151 ng/dL — ABNORMAL LOW (ref 250–1100)

## 2020-01-10 ENCOUNTER — Other Ambulatory Visit: Payer: Self-pay

## 2020-01-10 ENCOUNTER — Other Ambulatory Visit: Payer: Medicaid Other | Attending: Internal Medicine | Admitting: Internal Medicine

## 2020-01-10 ENCOUNTER — Other Ambulatory Visit (INDEPENDENT_AMBULATORY_CARE_PROVIDER_SITE_OTHER): Payer: Medicaid Other

## 2020-01-10 DIAGNOSIS — E291 Testicular hypofunction: Secondary | ICD-10-CM | POA: Insufficient documentation

## 2020-01-10 NOTE — Ancillary Notes (Signed)
Department of Community Practice     Venipuncture performed in office on right  arm antecubital vein, dry pressure dressing was applied to site and patient tolerated it well.  Specimen was centrifuged, aliquoted as needed and specimen was labeled and packaged for transport.    Christopher Khan  01/10/2020, 14:35

## 2020-01-11 ENCOUNTER — Other Ambulatory Visit (INDEPENDENT_AMBULATORY_CARE_PROVIDER_SITE_OTHER): Payer: Medicaid Other

## 2020-01-11 ENCOUNTER — Other Ambulatory Visit: Payer: Medicaid Other | Attending: Internal Medicine | Admitting: Internal Medicine

## 2020-01-11 DIAGNOSIS — E291 Testicular hypofunction: Secondary | ICD-10-CM

## 2020-01-11 NOTE — Ancillary Notes (Signed)
Department of Community Practice     Venipuncture performed in office on left arm antecubital vein, dry pressure dressing was applied to site and patient tolerated it well.  Specimen was centrifuged, aliquoted as needed and specimen was labeled and packaged for transport.    Genevie Cheshire, PHLEBOTOMIST  01/11/2020, 14:33

## 2020-01-13 LAB — TESTOSTERONE, TOTAL, LCMS, SERUM: TESTOSTERONE, TOTAL, SERUM: 211 ng/dL — ABNORMAL LOW (ref 240–950)

## 2020-01-14 LAB — TESTOSTERONE, TOTAL, LCMS, SERUM: TESTOSTERONE, TOTAL, SERUM: 165 ng/dL — ABNORMAL LOW (ref 240–950)
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
## 2020-03-22 ENCOUNTER — Ambulatory Visit: Payer: Medicaid Other | Attending: Internal Medicine

## 2020-03-22 ENCOUNTER — Other Ambulatory Visit (INDEPENDENT_AMBULATORY_CARE_PROVIDER_SITE_OTHER): Payer: Self-pay | Admitting: Internal Medicine

## 2020-03-22 DIAGNOSIS — S0992XA Unspecified injury of nose, initial encounter: Secondary | ICD-10-CM

## 2020-03-22 DIAGNOSIS — J3489 Other specified disorders of nose and nasal sinuses: Secondary | ICD-10-CM | POA: Insufficient documentation

## 2020-04-05 ENCOUNTER — Other Ambulatory Visit: Payer: Self-pay

## 2020-04-05 ENCOUNTER — Other Ambulatory Visit (INDEPENDENT_AMBULATORY_CARE_PROVIDER_SITE_OTHER): Payer: Medicaid Other

## 2020-04-05 DIAGNOSIS — E781 Pure hyperglyceridemia: Secondary | ICD-10-CM

## 2020-04-05 DIAGNOSIS — E559 Vitamin D deficiency, unspecified: Secondary | ICD-10-CM

## 2020-04-05 LAB — LIPID PANEL
CHOLESTEROL: 178 mg/dL (ref 0–199)
HDL CHOL: 41 mg/dL (ref 40–60)
LDL CALC: 96 mg/dL (ref 0–130)
TRIGLYCERIDES: 203 mg/dL — ABNORMAL HIGH (ref 0–149)
VLDL CALC: 41 mg/dL

## 2020-04-05 LAB — COMPREHENSIVE METABOLIC PANEL, NON-FASTING
ALBUMIN: 4.6 g/dL (ref 3.5–5.0)
ALKALINE PHOSPHATASE: 95 U/L (ref 38–126)
ALT (SGPT): 28 U/L (ref <=50)
ANION GAP: 8 mmol/L
AST (SGOT): 34 U/L (ref 17–59)
BILIRUBIN TOTAL: 0.6 mg/dL (ref 0.2–5.0)
BUN/CREA RATIO: 8
BUN: 7 mg/dL — ABNORMAL LOW (ref 9–20)
CALCIUM: 9.5 mg/dL (ref 8.4–10.2)
CHLORIDE: 103 mmol/L (ref 98–107)
CO2 TOTAL: 28 mmol/L (ref 22–30)
CREATININE: 0.83 mg/dL (ref 0.66–1.25)
ESTIMATED GFR: 120 mL/min/{1.73_m2} (ref >=60)
GLUCOSE: 88 mg/dL (ref 74–106)
POTASSIUM: 4.1 mmol/L (ref 3.5–5.1)
PROTEIN TOTAL: 7.4 g/dL (ref 6.3–8.2)
SODIUM: 139 mmol/L (ref 137–145)

## 2020-04-05 LAB — VITAMIN D 25 TOTAL: VITAMIN D 25, TOTAL: 36 ng/mL (ref 30.00–100.00)

## 2020-04-05 NOTE — Ancillary Notes (Signed)
Department of Community Practice     Venipuncture performed in office on right arm antecubital vein, dry pressure dressing was applied to site and patient tolerated it well.  Specimen was centrifuged, aliquoted as needed and specimen was labeled and packaged for transport.    Arnette Norris, PHLEBOTOMIST  04/05/2020, 13:04

## 2020-08-10 ENCOUNTER — Other Ambulatory Visit (HOSPITAL_COMMUNITY): Payer: Self-pay

## 2020-08-10 LAB — EXTERNAL COVID-19 MOLECULAR RESULT: External 2019-n-CoV/SARS-CoV-2: POSITIVE — AB

## 2020-08-17 ENCOUNTER — Other Ambulatory Visit (HOSPITAL_COMMUNITY): Payer: Self-pay

## 2020-08-17 LAB — EXTERNAL COVID-19 MOLECULAR RESULT: External 2019-n-CoV/SARS-CoV-2: POSITIVE — AB
